# Patient Record
Sex: Female | Born: 1950 | Race: Black or African American | Hispanic: No | Marital: Married | Smoking: Never smoker
Health system: Southern US, Community
[De-identification: ages and names within clinical notes are randomized; demographics above are authoritative.]

## PROBLEM LIST (undated history)

## (undated) DIAGNOSIS — R202 Paresthesia of skin: Secondary | ICD-10-CM

## (undated) DIAGNOSIS — I10 Essential (primary) hypertension: Secondary | ICD-10-CM

## (undated) HISTORY — PX: APPENDECTOMY: SHX54

## (undated) HISTORY — PX: COLONOSCOPY: SHX174

---

## 2015-12-16 ENCOUNTER — Other Ambulatory Visit: Payer: Self-pay | Admitting: Neurosurgery

## 2015-12-27 ENCOUNTER — Encounter (HOSPITAL_COMMUNITY)
Admission: RE | Admit: 2015-12-27 | Discharge: 2015-12-27 | Disposition: A | Discharge status: Home / Self Care | Payer: Medicare Other | Source: Ambulatory Visit | Attending: Neurosurgery | Admitting: Neurosurgery

## 2015-12-27 ENCOUNTER — Encounter (HOSPITAL_COMMUNITY): Payer: Self-pay | Admitting: Urology

## 2015-12-27 HISTORY — DX: Paresthesia of skin: R20.2

## 2015-12-27 HISTORY — DX: Essential (primary) hypertension: I10

## 2015-12-27 LAB — CBC
HEMATOCRIT: 39.5 % (ref 36.0–46.0)
HEMOGLOBIN: 13.3 g/dL (ref 12.0–15.0)
MCH: 30 pg (ref 26.0–34.0)
MCHC: 33.7 g/dL (ref 30.0–36.0)
MCV: 89.2 fL (ref 78.0–100.0)
Platelets: 255 10*3/uL (ref 150–400)
RBC: 4.43 MIL/uL (ref 3.87–5.11)
RDW: 13.8 % (ref 11.5–15.5)
WBC: 5.9 10*3/uL (ref 4.0–10.5)

## 2015-12-27 LAB — BASIC METABOLIC PANEL
ANION GAP: 10 (ref 5–15)
BUN: 9 mg/dL (ref 6–20)
CHLORIDE: 101 mmol/L (ref 101–111)
CO2: 26 mmol/L (ref 22–32)
Calcium: 9.6 mg/dL (ref 8.9–10.3)
Creatinine, Ser: 0.81 mg/dL (ref 0.44–1.00)
Glucose, Bld: 116 mg/dL — ABNORMAL HIGH (ref 65–99)
POTASSIUM: 3.7 mmol/L (ref 3.5–5.1)
SODIUM: 137 mmol/L (ref 135–145)

## 2015-12-27 LAB — TYPE AND SCREEN
ABO/RH(D): O POS
ANTIBODY SCREEN: NEGATIVE

## 2015-12-27 LAB — SURGICAL PCR SCREEN
MRSA, PCR: NEGATIVE
STAPHYLOCOCCUS AUREUS: NEGATIVE

## 2015-12-27 LAB — ABO/RH: ABO/RH(D): O POS

## 2015-12-27 NOTE — Progress Notes (Signed)
Anesthesia chart review: Patient is a 65 year old female scheduled for L4-5 PLIF on 12/28/15 by Dr. Franky Machoabbell. PAT was this morning.  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  PCP is Donnita FallsWendy Rodriguez, NP at [facility] in [place], [place].  Meds include Zovirax, Activella, tramadol, Diovan-HCT, vitamin D, OTC supplement for hot flashes.  BP (!) 157/74   Pulse 79   Temp 36.9 C (Oral)   Resp 18   Ht 5\' 7"  (1.702 m)   Wt 174 lb 5 oz (79.1 kg)   LMP  (Approximate) Comment: LMP 4 years ago  SpO2 100%   BMI 27.30 kg/m   12/27/15 EKG: NSR, first degree AV block. Patient denied any recent EKG or cardiac studies (reported normal stress and echo 20 years ago). She denied CP and SOB at PAT.  Preoperative labs noted.   If no acute changes then I anticipate that she can proceed as planned.  Velna Ochsllison Keywon Mestre, PA-C [facility]Anesthesiology Phone [phone] 12/27/2015 11:19 AM

## 2015-12-27 NOTE — Progress Notes (Addendum)
PCP: [name], [name] in [place] No cardiologist.  Echo and stress test, EKG done 20 yr ago per pt, pt reports no cardiac issues. Pt does not remember which doctor did these tests  EKG: 12/27/15, 1st degree AV block, EKG forwarded to Edmonia CaprioAllison Z with anesthesia  No complaints of chest pain, SOB or signs of infection per pt at PAT appointment.

## 2015-12-27 NOTE — Pre-Procedure Instructions (Signed)
Nancy ReeBrenda Pratt  12/27/2015      [facility] [phone] - [place] - [address] AT [facility] [address] [place] [id number] Phone: [phone] Fax: [phone]    Your procedure is scheduled on Wednesday October 25.  [facility] Admitting at 9:00 A.M.  Call this number if you have problems the morning of surgery:  [phone]   Remember:  Do not eat food or drink liquids after midnight.  Take these medicines the morning of surgery with A SIP OF WATER: acyclovir (Zovirax), Activella, tramadol (Ultram)  STOP taking any Aspirin, Aleve, Naproxen, Ibuprofen, Motrin, Advil, Goody's, BC's, all herbal medications, fish oil, and all vitamins    Do not wear jewelry, make-up or nail polish.  Do not wear lotions, powders, or perfumes, or deoderant.  Do not shave 48 hours prior to surgery.  Men may shave face and neck.  Do not bring valuables to the hospital.  [facility] is not responsible for any belongings or valuables.  Contacts, dentures or bridgework may not be worn into surgery.  Leave your suitcase in the car.  After surgery it may be brought to your room.  For patients admitted to the hospital, discharge time will be determined by your treatment team.  Patients discharged the day of surgery will not be allowed to drive home.   Special instructions:     [place]- Preparing For Surgery  Before surgery, you can play an important role. Because skin is not sterile, your skin needs to be as free of germs as possible. You can reduce the number of germs on your skin by washing with CHG (chlorahexidine gluconate) Soap before surgery.  CHG is an antiseptic cleaner which kills germs and bonds with the skin to continue killing germs even after washing.  Please do not use if you have an allergy to CHG or antibacterial soaps. If your skin becomes reddened/irritated stop using the CHG.  Do not shave (including legs and underarms)  for at least 48 hours prior to first CHG shower. It is OK to shave your face.  Please follow these instructions carefully.   1. Shower the NIGHT BEFORE SURGERY and the MORNING OF SURGERY with CHG.   2. If you chose to wash your hair, wash your hair first as usual with your normal shampoo.  3. After you shampoo, rinse your hair and body thoroughly to remove the shampoo.  4. Use CHG as you would any other liquid soap. You can apply CHG directly to the skin and wash gently with a scrungie or a clean washcloth.   5. Apply the CHG Soap to your body ONLY FROM THE NECK DOWN.  Do not use on open wounds or open sores. Avoid contact with your eyes, ears, mouth and genitals (private parts). Wash genitals (private parts) with your normal soap.  6. Wash thoroughly, paying special attention to the area where your surgery will be performed.  7. Thoroughly rinse your body with warm water from the neck down.  8. DO NOT shower/wash with your normal soap after using and rinsing off the CHG Soap.  9. Pat yourself dry with a CLEAN TOWEL.   10. Wear CLEAN PAJAMAS   11. Place CLEAN SHEETS on your bed the night of your first shower and DO NOT SLEEP WITH PETS.    Day of Surgery: Do not apply any deodorants/lotions. Please wear clean clothes to the hospital/surgery center.  Please read over the following fact sheets that you were given. MRSA Information

## 2015-12-28 ENCOUNTER — Encounter (HOSPITAL_COMMUNITY): Payer: Self-pay | Admitting: *Deleted

## 2015-12-28 ENCOUNTER — Inpatient Hospital Stay (HOSPITAL_COMMUNITY): Payer: Medicare Other

## 2015-12-28 ENCOUNTER — Inpatient Hospital Stay (HOSPITAL_COMMUNITY): Payer: Medicare Other | Admitting: Certified Registered Nurse Anesthetist

## 2015-12-28 ENCOUNTER — Inpatient Hospital Stay (HOSPITAL_COMMUNITY)
Admission: RE | Admit: 2015-12-28 | Discharge: 2015-12-31 | DRG: 460 | Disposition: A | Discharge status: Home / Self Care | Payer: Medicare Other | Source: Ambulatory Visit | Attending: Neurosurgery | Admitting: Neurosurgery

## 2015-12-28 ENCOUNTER — Encounter (HOSPITAL_COMMUNITY)
Admission: RE | Disposition: A | Discharge status: Home / Self Care | Payer: Self-pay | Source: Ambulatory Visit | Attending: Neurosurgery

## 2015-12-28 DIAGNOSIS — Z419 Encounter for procedure for purposes other than remedying health state, unspecified: Secondary | ICD-10-CM

## 2015-12-28 DIAGNOSIS — M4316 Spondylolisthesis, lumbar region: Principal | ICD-10-CM | POA: Diagnosis present

## 2015-12-28 DIAGNOSIS — M5416 Radiculopathy, lumbar region: Secondary | ICD-10-CM | POA: Diagnosis present

## 2015-12-28 DIAGNOSIS — Z79899 Other long term (current) drug therapy: Secondary | ICD-10-CM

## 2015-12-28 DIAGNOSIS — I10 Essential (primary) hypertension: Secondary | ICD-10-CM | POA: Diagnosis present

## 2015-12-28 LAB — CBC
HCT: 35 % — ABNORMAL LOW (ref 36.0–46.0)
Hemoglobin: 11.8 g/dL — ABNORMAL LOW (ref 12.0–15.0)
MCH: 29.9 pg (ref 26.0–34.0)
MCHC: 33.7 g/dL (ref 30.0–36.0)
MCV: 88.8 fL (ref 78.0–100.0)
Platelets: 211 10*3/uL (ref 150–400)
RBC: 3.94 MIL/uL (ref 3.87–5.11)
RDW: 13.7 % (ref 11.5–15.5)
WBC: 9.9 10*3/uL (ref 4.0–10.5)

## 2015-12-28 LAB — GLUCOSE, CAPILLARY: GLUCOSE-CAPILLARY: 160 mg/dL — AB (ref 65–99)

## 2015-12-28 LAB — CREATININE, SERUM
Creatinine, Ser: 0.83 mg/dL (ref 0.44–1.00)
GFR calc Af Amer: >60 mL/min (ref >60)
GFR calc non Af Amer: >60 mL/min (ref >60)

## 2015-12-28 SURGERY — POSTERIOR LUMBAR FUSION 1 LEVEL
Anesthesia: General | Site: Spine Lumbar

## 2015-12-28 MED ORDER — ACETAMINOPHEN 325 MG PO TABS: 650.0000 mg | ORAL_TABLET | ORAL | Status: DC | PRN

## 2015-12-28 MED ORDER — LIDOCAINE-EPINEPHRINE 1 %-1:100000 IJ SOLN
INTRAMUSCULAR | Status: DC | PRN
  Administered 2015-12-28: 10 mL

## 2015-12-28 MED ORDER — THROMBIN 20000 UNITS EX SOLR
CUTANEOUS | Status: AC
  Filled 2015-12-28: qty 20000

## 2015-12-28 MED ORDER — ONDANSETRON HCL 4 MG/2ML IJ SOLN
INTRAMUSCULAR | Status: DC | PRN
  Administered 2015-12-28 (×2): 4 mg via INTRAVENOUS

## 2015-12-28 MED ORDER — ACYCLOVIR 800 MG PO TABS
800.0000 mg | ORAL_TABLET | Freq: Every day | ORAL | Status: DC
  Administered 2015-12-29 – 2015-12-31 (×3): 800 mg via ORAL
  Filled 2015-12-28 (×3): qty 1

## 2015-12-28 MED ORDER — VALSARTAN-HYDROCHLOROTHIAZIDE 80-12.5 MG PO TABS: 1.0000 | ORAL_TABLET | Freq: Every day | ORAL | Status: DC

## 2015-12-28 MED ORDER — MENTHOL 3 MG MT LOZG: 1.0000 | LOZENGE | OROMUCOSAL | Status: DC | PRN

## 2015-12-28 MED ORDER — DIAZEPAM 5 MG PO TABS
5.0000 mg | ORAL_TABLET | Freq: Four times a day (QID) | ORAL | Status: DC | PRN
  Administered 2015-12-31: 5 mg via ORAL
  Filled 2015-12-28: qty 1

## 2015-12-28 MED ORDER — HYDROMORPHONE HCL 2 MG/ML IJ SOLN
INTRAMUSCULAR | Status: AC
  Administered 2015-12-28 (×2): 0.5 mg
  Filled 2015-12-28: qty 1

## 2015-12-28 MED ORDER — DEXAMETHASONE SODIUM PHOSPHATE 10 MG/ML IJ SOLN
INTRAMUSCULAR | Status: DC | PRN
  Administered 2015-12-28: 10 mg via INTRAVENOUS

## 2015-12-28 MED ORDER — HYDROMORPHONE HCL 1 MG/ML IJ SOLN
0.2500 mg | INTRAMUSCULAR | Status: DC | PRN
  Administered 2015-12-28 (×2): 0.5 mg via INTRAVENOUS

## 2015-12-28 MED ORDER — HYDROMORPHONE HCL 2 MG/ML IJ SOLN
INTRAMUSCULAR | Status: AC
  Filled 2015-12-28: qty 1

## 2015-12-28 MED ORDER — PROMETHAZINE HCL 25 MG/ML IJ SOLN: 6.2500 mg | INTRAMUSCULAR | Status: DC | PRN

## 2015-12-28 MED ORDER — HEPARIN SODIUM (PORCINE) 5000 UNIT/ML IJ SOLN
5000.0000 [IU] | Freq: Three times a day (TID) | INTRAMUSCULAR | Status: DC
  Administered 2015-12-29 – 2015-12-31 (×5): 5000 [IU] via SUBCUTANEOUS
  Filled 2015-12-28 (×5): qty 1

## 2015-12-28 MED ORDER — SENNA 8.6 MG PO TABS
1.0000 | ORAL_TABLET | Freq: Two times a day (BID) | ORAL | Status: DC
  Administered 2015-12-29 – 2015-12-31 (×5): 8.6 mg via ORAL
  Filled 2015-12-28 (×5): qty 1

## 2015-12-28 MED ORDER — POTASSIUM CHLORIDE IN NACL 20-0.9 MEQ/L-% IV SOLN
INTRAVENOUS | Status: DC
  Administered 2015-12-29: 04:00:00 via INTRAVENOUS
  Filled 2015-12-28 (×2): qty 1000

## 2015-12-28 MED ORDER — CEFAZOLIN SODIUM-DEXTROSE 2-4 GM/100ML-% IV SOLN
INTRAVENOUS | Status: AC
  Filled 2015-12-28: qty 100

## 2015-12-28 MED ORDER — MORPHINE SULFATE (PF) 2 MG/ML IV SOLN
1.0000 mg | INTRAVENOUS | Status: DC | PRN
  Administered 2015-12-30: 2 mg via INTRAVENOUS
  Filled 2015-12-28: qty 1

## 2015-12-28 MED ORDER — LACTATED RINGERS IV SOLN
INTRAVENOUS | Status: DC | PRN
  Administered 2015-12-28 (×2): via INTRAVENOUS

## 2015-12-28 MED ORDER — GLYCOPYRROLATE 0.2 MG/ML IJ SOLN
INTRAMUSCULAR | Status: DC | PRN
  Administered 2015-12-28: 0.4 mg via INTRAVENOUS
  Administered 2015-12-28: 0.2 mg via INTRAVENOUS

## 2015-12-28 MED ORDER — NEOSTIGMINE METHYLSULFATE 10 MG/10ML IV SOLN
INTRAVENOUS | Status: DC | PRN
  Administered 2015-12-28: 3 mg via INTRAVENOUS

## 2015-12-28 MED ORDER — LACTATED RINGERS IV SOLN
Freq: Once | INTRAVENOUS | Status: AC
  Administered 2015-12-28: 09:00:00 via INTRAVENOUS

## 2015-12-28 MED ORDER — FENTANYL CITRATE (PF) 100 MCG/2ML IJ SOLN
INTRAMUSCULAR | Status: DC | PRN
  Administered 2015-12-28 (×3): 50 ug via INTRAVENOUS

## 2015-12-28 MED ORDER — 0.9 % SODIUM CHLORIDE (POUR BTL) OPTIME
TOPICAL | Status: DC | PRN
  Administered 2015-12-28: 1000 mL

## 2015-12-28 MED ORDER — FENTANYL CITRATE (PF) 100 MCG/2ML IJ SOLN
INTRAMUSCULAR | Status: AC
  Filled 2015-12-28: qty 4

## 2015-12-28 MED ORDER — ESTRADIOL-NORETHINDRONE ACET 1-0.5 MG PO TABS: 1.0000 | ORAL_TABLET | Freq: Every day | ORAL | Status: DC

## 2015-12-28 MED ORDER — EPHEDRINE SULFATE 50 MG/ML IJ SOLN
INTRAMUSCULAR | Status: DC | PRN
  Administered 2015-12-28: 5 mg via INTRAVENOUS

## 2015-12-28 MED ORDER — BUPIVACAINE HCL (PF) 0.5 % IJ SOLN
INTRAMUSCULAR | Status: AC
  Filled 2015-12-28: qty 30

## 2015-12-28 MED ORDER — LIDOCAINE HCL (CARDIAC) 20 MG/ML IV SOLN
INTRAVENOUS | Status: DC | PRN
  Administered 2015-12-28: 60 mg via INTRAVENOUS

## 2015-12-28 MED ORDER — SODIUM CHLORIDE 0.9 % IV SOLN
250.0000 mL | INTRAVENOUS | Status: DC
Start: 2015-12-28 — End: 2015-12-31

## 2015-12-28 MED ORDER — HYDROCODONE-ACETAMINOPHEN 5-325 MG PO TABS
1.0000 | ORAL_TABLET | ORAL | Status: DC | PRN
  Administered 2015-12-29 – 2015-12-31 (×3): 2 via ORAL
  Filled 2015-12-28 (×4): qty 2

## 2015-12-28 MED ORDER — ONDANSETRON HCL 4 MG/2ML IJ SOLN: 4.0000 mg | INTRAMUSCULAR | Status: DC | PRN

## 2015-12-28 MED ORDER — PHENOL 1.4 % MT LIQD: 1.0000 | OROMUCOSAL | Status: DC | PRN

## 2015-12-28 MED ORDER — SODIUM CHLORIDE 0.9% FLUSH
3.0000 mL | Freq: Two times a day (BID) | INTRAVENOUS | Status: DC
  Administered 2015-12-29 – 2015-12-30 (×2): 3 mL via INTRAVENOUS

## 2015-12-28 MED ORDER — OXYCODONE-ACETAMINOPHEN 5-325 MG PO TABS
1.0000 | ORAL_TABLET | ORAL | Status: DC | PRN
  Administered 2015-12-28 – 2015-12-31 (×11): 2 via ORAL
  Filled 2015-12-28 (×11): qty 2

## 2015-12-28 MED ORDER — ARTIFICIAL TEARS OP OINT
TOPICAL_OINTMENT | OPHTHALMIC | Status: DC | PRN
  Administered 2015-12-28: 1 via OPHTHALMIC

## 2015-12-28 MED ORDER — THROMBIN 20000 UNITS EX SOLR
CUTANEOUS | Status: DC | PRN
  Administered 2015-12-28: 15:00:00 via TOPICAL

## 2015-12-28 MED ORDER — SODIUM CHLORIDE 0.9% FLUSH: 3.0000 mL | INTRAVENOUS | Status: DC | PRN

## 2015-12-28 MED ORDER — ZOLPIDEM TARTRATE 5 MG PO TABS: 5.0000 mg | ORAL_TABLET | Freq: Every evening | ORAL | Status: DC | PRN

## 2015-12-28 MED ORDER — VITAMIN D (ERGOCALCIFEROL) 1.25 MG (50000 UNIT) PO CAPS: 50000.0000 [IU] | ORAL_CAPSULE | ORAL | Status: DC

## 2015-12-28 MED ORDER — ROCURONIUM BROMIDE 100 MG/10ML IV SOLN
INTRAVENOUS | Status: DC | PRN
  Administered 2015-12-28: 50 mg via INTRAVENOUS

## 2015-12-28 MED ORDER — MIDAZOLAM HCL 5 MG/5ML IJ SOLN
INTRAMUSCULAR | Status: DC | PRN
  Administered 2015-12-28: 2 mg via INTRAVENOUS

## 2015-12-28 MED ORDER — BUPIVACAINE HCL (PF) 0.5 % IJ SOLN
INTRAMUSCULAR | Status: DC | PRN
  Administered 2015-12-28: 30 mL

## 2015-12-28 MED ORDER — LIDOCAINE-EPINEPHRINE 1 %-1:100000 IJ SOLN
INTRAMUSCULAR | Status: AC
  Filled 2015-12-28: qty 1

## 2015-12-28 MED ORDER — BISACODYL 5 MG PO TBEC: 5.0000 mg | DELAYED_RELEASE_TABLET | Freq: Every day | ORAL | Status: DC | PRN

## 2015-12-28 MED ORDER — CEFAZOLIN SODIUM-DEXTROSE 2-3 GM-% IV SOLR
INTRAVENOUS | Status: DC | PRN
  Administered 2015-12-28: 2 g via INTRAVENOUS

## 2015-12-28 MED ORDER — ACETAMINOPHEN 650 MG RE SUPP
650.0000 mg | RECTAL | Status: DC | PRN
Start: 2015-12-28 — End: 2015-12-31

## 2015-12-28 MED ORDER — IRBESARTAN 150 MG PO TABS
75.0000 mg | ORAL_TABLET | Freq: Every day | ORAL | Status: DC
  Administered 2015-12-31: 75 mg via ORAL
  Filled 2015-12-28: qty 1

## 2015-12-28 MED ORDER — HYDROCHLOROTHIAZIDE 12.5 MG PO CAPS
12.5000 mg | ORAL_CAPSULE | Freq: Every day | ORAL | Status: DC
  Administered 2015-12-31: 12.5 mg via ORAL
  Filled 2015-12-28: qty 1

## 2015-12-28 MED ORDER — SENNOSIDES-DOCUSATE SODIUM 8.6-50 MG PO TABS: 1.0000 | ORAL_TABLET | Freq: Every evening | ORAL | Status: DC | PRN

## 2015-12-28 MED ORDER — PROPOFOL 10 MG/ML IV BOLUS
INTRAVENOUS | Status: DC | PRN
  Administered 2015-12-28: 150 mg via INTRAVENOUS

## 2015-12-28 MED ORDER — MAGNESIUM CITRATE PO SOLN: 1.0000 | Freq: Once | ORAL | Status: DC | PRN

## 2015-12-28 MED ORDER — MIDAZOLAM HCL 2 MG/2ML IJ SOLN
INTRAMUSCULAR | Status: AC
  Filled 2015-12-28: qty 2

## 2015-12-28 SURGICAL SUPPLY — 54 items
BUR MATCHSTICK NEURO 3.0 LAGG (BURR) ×2 IMPLANT
BUR PRECISION FLUTE 5.0 (BURR) ×2 IMPLANT
CANISTER SUCT 3000ML PPV (MISCELLANEOUS) ×2 IMPLANT
CAP RELINE MOD TULIP RMM (Cap) ×8 IMPLANT
CONT SPEC 4OZ CLIKSEAL STRL BL (MISCELLANEOUS) ×2 IMPLANT
COVER BACK TABLE 60X90IN (DRAPES) ×2 IMPLANT
DERMABOND ADVANCED (GAUZE/BANDAGES/DRESSINGS) ×1
DERMABOND ADVANCED .7 DNX12 (GAUZE/BANDAGES/DRESSINGS) ×1 IMPLANT
DRAPE C-ARM 42X72 X-RAY (DRAPES) ×4 IMPLANT
DRAPE C-ARMOR (DRAPES) ×2 IMPLANT
DRAPE LAPAROTOMY 100X72X124 (DRAPES) ×2 IMPLANT
DRAPE POUCH INSTRU U-SHP 10X18 (DRAPES) ×2 IMPLANT
DRAPE SURG 17X23 STRL (DRAPES) ×2 IMPLANT
DRSG OPSITE POSTOP 4X6 (GAUZE/BANDAGES/DRESSINGS) ×2 IMPLANT
DURAPREP 26ML APPLICATOR (WOUND CARE) ×2 IMPLANT
ELECT REM PT RETURN 9FT ADLT (ELECTROSURGICAL) ×2
ELECTRODE REM PT RTRN 9FT ADLT (ELECTROSURGICAL) ×1 IMPLANT
GAUZE SPONGE 4X4 12PLY STRL (GAUZE/BANDAGES/DRESSINGS) IMPLANT
GAUZE SPONGE 4X4 16PLY XRAY LF (GAUZE/BANDAGES/DRESSINGS) IMPLANT
GLOVE BIO SURGEON STRL SZ8 (GLOVE) ×2 IMPLANT
GLOVE BIO SURGEON STRL SZ8.5 (GLOVE) ×2 IMPLANT
GLOVE ECLIPSE 6.5 STRL STRAW (GLOVE) ×4 IMPLANT
GOWN STRL REUS W/ TWL LRG LVL3 (GOWN DISPOSABLE) ×2 IMPLANT
GOWN STRL REUS W/ TWL XL LVL3 (GOWN DISPOSABLE) IMPLANT
GOWN STRL REUS W/TWL 2XL LVL3 (GOWN DISPOSABLE) IMPLANT
GOWN STRL REUS W/TWL LRG LVL3 (GOWN DISPOSABLE) ×2
GOWN STRL REUS W/TWL XL LVL3 (GOWN DISPOSABLE)
KIT BASIN OR (CUSTOM PROCEDURE TRAY) ×2 IMPLANT
KIT POSITION SURG JACKSON T1 (MISCELLANEOUS) ×2 IMPLANT
KIT ROOM TURNOVER OR (KITS) ×2 IMPLANT
NEEDLE HYPO 25X1 1.5 SAFETY (NEEDLE) ×2 IMPLANT
NEEDLE SPNL 18GX3.5 QUINCKE PK (NEEDLE) IMPLANT
NS IRRIG 1000ML POUR BTL (IV SOLUTION) ×2 IMPLANT
PACK LAMINECTOMY NEURO (CUSTOM PROCEDURE TRAY) ×2 IMPLANT
PAD ARMBOARD 7.5X6 YLW CONV (MISCELLANEOUS) ×4 IMPLANT
PUTTY DBX 1CC (Putty) ×2 IMPLANT
PUTTY DBX 1CC DEPUY (Putty) ×1 IMPLANT
ROD RELINE COCR LORD 5X30 (Rod) ×4 IMPLANT
SCREW LOCK RSS 4.5/5.0MM (Screw) ×8 IMPLANT
SCREW SHANK RELINE MOD 5.5X35 (Screw) ×8 IMPLANT
SPACER OPAL 10X24 (Orthopedic Implant) ×2 IMPLANT
SPACER OPAL 10X24 12MM (Spacer) ×2 IMPLANT
SPONGE LAP 4X18 X RAY DECT (DISPOSABLE) IMPLANT
SPONGE SURGIFOAM ABS GEL 100 (HEMOSTASIS) ×2 IMPLANT
STRIP CLOSURE SKIN 1/2X4 (GAUZE/BANDAGES/DRESSINGS) IMPLANT
SUT PROLENE 6 0 BV (SUTURE) IMPLANT
SUT VIC AB 0 CT1 18XCR BRD8 (SUTURE) ×1 IMPLANT
SUT VIC AB 0 CT1 8-18 (SUTURE) ×1
SUT VIC AB 2-0 CT1 18 (SUTURE) ×2 IMPLANT
SUT VIC AB 3-0 SH 8-18 (SUTURE) ×2 IMPLANT
TOWEL OR 17X24 6PK STRL BLUE (TOWEL DISPOSABLE) ×2 IMPLANT
TOWEL OR 17X26 10 PK STRL BLUE (TOWEL DISPOSABLE) ×2 IMPLANT
TRAY FOLEY W/METER SILVER 16FR (SET/KITS/TRAYS/PACK) ×2 IMPLANT
WATER STERILE IRR 1000ML POUR (IV SOLUTION) ×2 IMPLANT

## 2015-12-28 NOTE — Transfer of Care (Signed)
Immediate Anesthesia Transfer of Care Note  Patient: Nancy Pratt  Procedure(s) Performed: Procedure(s) with comments: LUMBAR FOUR-FIVE POSTERIOR LUMBAR INTERBODY FUSION (N/A) - POSTERIOR LUMBAR INTERBODY FUSION 1 L4-L5  Patient Location: PACU  Anesthesia Type:General  Level of Consciousness: awake  Airway & Oxygen Therapy: Patient Spontanous Breathing  Post-op Assessment: Report given to RN and Post -op Vital signs reviewed and stable  Post vital signs: Reviewed and stable  Last Vitals:  Vitals:   12/28/15 0908  BP: (!) 174/77  Pulse: 70  Resp: 18  Temp: 36.9 C    Last Pain:  Vitals:   12/28/15 0908  TempSrc: Oral         Complications: No apparent anesthesia complications

## 2015-12-28 NOTE — H&P (Signed)
BP (!) 174/77   Pulse 70   Temp 98.4 F (36.9 C) (Oral)   Resp 18   Ht 5\' 7"  (1.702 m)   Wt 79.1 kg (174 lb 5 oz)   LMP  (Approximate)   SpO2 99%   BMI 27.30 kg/m  Nancy Pratt is a 65 year old woman who presents today for evaluation of pain that she has in her back and lower extremities, right much worse than left. She has had this pain now for a number of years, and has been undergoing conservative treatment.  In her words, she has ongoing pain, swelling of the left lower extremity, tingling and neck popping with no pain. She says her problems started in 11/2013. She has no recollection of a specific etiology prior to the pain onset. She has pain in her legs, back, and buttocks, sometimes resting or sitting will help relieve the pain. She says the pain is 6/10. It has gotten worse over the last six months, and has recently been bad. She had to quit work in 05/2015 because she can no longer tolerate being on her feet. She does have numbness and tingling in her toes and feet. She has had recent weight gain because she is unable to exercise or walk on a track. She has had epidural injections, she has had dry needling, she has had physical therapy, she has been under the care of both a primary care physician and her other physician during this time which is over a year without there being any relief. She has also had chiropractic treatment massage therapy, ice, and she has used a TENS unit. REVIEW OF SYSTEMS: Review of systems is positive for night sweats, balance problems, hypercholesterolemia, swelling in the hands and legs, leg pain with walking, leg weakness, leg pain, joint pain. She says mostly all the pain is in back, legs, joint swelling on the right side of the body, most leg pain and swelling in intact. Her tingling in the toes is constant making it difficult to sleep.  PAST MEDICAL HISTORY: Past medical history is significant for hypertension. PAST SURGICAL HISTORY: She has had a cesarean  section. FAMILY HISTORY: Mother is deceased. Father is 5087 in fair health, has hypertension and thyroid disease. Hypertension, diabetes, visual problems, and multiple sclerosis present in the family history. SOCIAL HISTORY: She does not smoke. She does not drink alcohol. She has no history of substance abuse. She is currently retired.  CURRENT MEDICATIONS: Medications are Activella, Valsartan, acyclovir, Vitamin D, and over-the-counter Turmeric.  PHYSICAL EXAMINATION: On examination she is alert, oriented x4, answering all questions appropriately. 5/5 strength in the upper and lower extremities. Normal muscle tone, bulk, coordination. Pupils are equal, round, and reactive to light. Full extraocular movements. Full visual fields. Hearing is intact to voice bilaterally. Uvula elevates in the midline. Shoulder shrug is normal. Tongue protrudes in the midline. She has an antalgic gait. Negative Romberg. She can stand on her toes and heels. Proprioception is intact in the lower extremities.  IMAGING STUDIES: MRI from 01/2015 and 04/2014 were reviewed. They showed significant facet arthropathy and foraminal compromise on the right side at L4-5 with a grade 1 spondylolisthesis uncovering of the disc at that level, spinal stenosis at that level, severe ligamentous hypertrophy at the L4-5 level. The saggital suggest a compromise nerve root on the right side at L5-S1, but when you look at the axials, you just do not get the same feeling the by means it is normal.  IMPRESSION/PLAN: Diagnosis is spondylolisthesis  lumbar spine, and lumbar radiculopathy. I gave her a detailed instruction sheet with regards to lumbar fusion and decompression, which would involve the use of pedicle screws and rods, and interbody cages.  She, at this point, says that she just has not been able to get better. There has been no improvement and at this point she would like to proceed with decompression and arthrodesis. We have discussed at length  the procedure at her last visit. So, she has a good understanding of what will be happening. She also received a detailed instruction sheet. I went over the risks and benefits, including bleeding, infection, no relief, need for further surgery, fusion failure, hardware failure, damage to the nerves, bowel or bladder dysfunction, and weakness in one or both lower extremities. She understands and wishes to proceed. We will schedule her procedure for 12/21/2015.

## 2015-12-28 NOTE — Anesthesia Preprocedure Evaluation (Signed)
Anesthesia Evaluation  Patient identified by MRN, date of birth, ID band Patient awake    Reviewed: Allergy & Precautions, NPO status , Patient's Chart, lab work & pertinent test results  Airway Mallampati: II  TM Distance: >3 FB Neck ROM: Full    Dental no notable dental hx.    Pulmonary neg pulmonary ROS,    Pulmonary exam normal breath sounds clear to auscultation       Cardiovascular hypertension, Normal cardiovascular exam Rhythm:Regular Rate:Normal     Neuro/Psych negative neurological ROS  negative psych ROS   GI/Hepatic negative GI ROS, Neg liver ROS,   Endo/Other  negative endocrine ROS  Renal/GU negative Renal ROS  negative genitourinary   Musculoskeletal negative musculoskeletal ROS (+)   Abdominal   Peds negative pediatric ROS (+)  Hematology negative hematology ROS (+)   Anesthesia Other Findings   Reproductive/Obstetrics negative OB ROS                             Anesthesia Physical Anesthesia Plan  ASA: II  Anesthesia Plan: General   Post-op Pain Management:    Induction: Intravenous  Airway Management Planned: Oral ETT  Additional Equipment:   Intra-op Plan:   Post-operative Plan: Extubation in OR  Informed Consent: I have reviewed the patients History and Physical, chart, labs and discussed the procedure including the risks, benefits and alternatives for the proposed anesthesia with the patient or authorized representative who has indicated his/her understanding and acceptance.   Dental advisory given  Plan Discussed with: CRNA and Surgeon  Anesthesia Plan Comments:         Anesthesia Quick Evaluation  

## 2015-12-28 NOTE — Anesthesia Procedure Notes (Signed)
Procedure Name: Intubation Date/Time: 12/28/2015 1:28 PM Performed by: Gavin PoundLOWDER, Iman Orourke J Pre-anesthesia Checklist: Patient identified, Emergency Drugs available, Suction available, Patient being monitored and Timeout performed

## 2015-12-28 NOTE — Progress Notes (Signed)
Patient arrived to unit

## 2015-12-28 NOTE — Anesthesia Postprocedure Evaluation (Signed)
Anesthesia Post Note  Patient: Nancy Pratt  Procedure(s) Performed: Procedure(s) (LRB): LUMBAR FOUR-FIVE POSTERIOR LUMBAR INTERBODY FUSION (N/A)  Patient location during evaluation: PACU Anesthesia Type: General Level of consciousness: awake and alert Pain management: pain level controlled Vital Signs Assessment: post-procedure vital signs reviewed and stable Respiratory status: spontaneous breathing, nonlabored ventilation, respiratory function stable and patient connected to nasal cannula oxygen Cardiovascular status: blood pressure returned to baseline and stable Postop Assessment: no signs of nausea or vomiting Anesthetic complications: no    Last Vitals:  Vitals:   12/28/15 0908  BP: (!) 174/77  Pulse: 70  Resp: 18  Temp: 36.9 C    Last Pain:  Vitals:   12/28/15 0908  TempSrc: Oral                 Reino KentJudd, Guynell Kleiber J

## 2015-12-28 NOTE — Op Note (Signed)
12/28/2015  5:01 PM  PATIENT:  Nancy Pratt  65 y.o. female with severe stenosis due to spondylolisthesis at L4/5. She has agreed to lumbar decompression and arthrodesis.  PRE-OPERATIVE DIAGNOSIS:  SPONDYLOLISTHESIS L4/5, osteoarthritis with radiculopathy lumbar spine  POST-OPERATIVE DIAGNOSIS:  same  PROCEDURE:  Procedure(s): LUMBAR FOUR-FIVE POSTERIOR LUMBAR INTERBODY FUSION, 11mm cage on right, 12mm x 24mm on left. Autograft and allograft morsels in the cages(Synthes) Lumbar laminectomy for decompression L4, L5 nerve roots Non segmental pedicle screw fixation L4, L5  SURGEON:  Surgeon(s): Coletta MemosKyle Neema Fluegge, MD Tressie StalkerJeffrey Jenkins, MD  ASSISTANTS:Jenkins, Tinnie GensJeffrey  ANESTHESIA:   general  EBL:  Total I/O In: 1550 [I.V.:1550] Out: 195 [Urine:95; Blood:100]  BLOOD ADMINISTERED:none  CELL SAVER GIVEN:none  COUNT:per nursing  DRAINS: none   SPECIMEN:  No Specimen  DICTATION: Nancy ReeBrenda Harbor is a 65 y.o. female whom was taken to the operating room intubated, and placed under a general anesthetic without difficulty. A foley catheter was placed under sterile conditions. She was positioned prone on a Jackson stable with all pressure points properly padded.  Her lumbar region was prepped and draped in a sterile manner. I infiltrated 30cc's 1/2%lidocaine/1:2000,000 strength epinephrine into the planned incision. I opened the skin with a 10 blade and took the incision down to the thoracolumbar fascia. I exposed the lamina of L4, and L5 in a subperiosteal fashion bilaterally. I confirmed my location with an intraoperative xray.  I placed self retaining retractors and started the decompression.  I decompressed the spinal canal via complete inferior facetectomies of L4 and L4 bilateral hemilaminectomes sparing the spinous processes. I used the drill and Kerrison punches to remove the ligamentum flavum, and to create a path from the canal to the extra foraminal space without compression. I exposed  far more than what was necessary to perform the PLIF. I did expose the disc spaces bilaterally. PLIF's were performed at L4/5 in the same fashion. I opened the disc space with a 15 blade then used a variety of instruments to remove the disc and prepare the space for the arthrodesis. I used curettes, rongeurs, punches, shavers for the disc space, and rasps in the discetomy. I measured the disc space and placed 2  Peek cages, 12mm x4224mm on the left, and 11mm x3524mm on the right(Synthes) into the disc space(s).   We placed pedicle screws at L4 and L5, using fluoroscopic guidance. I drilled a pilot hole, then cannulated the pedicle with a drill at each site. I then tapped each pedicle, assessing each site for pedicle violations. No cutouts were appreciated. Screws (nuvasive mas plif new) were then placed at each site without difficulty. We attached rods and locking caps with the appropriate tools. The locking caps were secured with torque limited screwdrivers. Final films were performed and the final construct appeared to be in good position.  I closed the wound in a layered fashion. I approximated the thoracolumbar fascia, subcutaneous, and subcuticular planes with vicryl sutures. I used dermabond and an occlusive bandage for a sterile dressing.     PLAN OF CARE: Admit to inpatient   PATIENT DISPOSITION:  PACU - hemodynamically stable.   Delay start of Pharmacological VTE agent (>24hrs) due to surgical blood loss or risk of bleeding:  yes

## 2015-12-29 LAB — GLUCOSE, CAPILLARY
GLUCOSE-CAPILLARY: 128 mg/dL — AB (ref 65–99)
GLUCOSE-CAPILLARY: 126 mg/dL — AB (ref 65–99)
Glucose-Capillary: 127 mg/dL — ABNORMAL HIGH (ref 65–99)

## 2015-12-29 NOTE — Evaluation (Signed)
Occupational Therapy Evaluation Patient Details Name: Nancy Pratt MRN: [id number] DOB: 09-Apr-1950 Today's Date: 12/29/2015    History of Present Illness Pt is a 65 y.o. female s/p lumbar 4-5 posterior lumbar interbody fusion, lumbar laminectomy for decompression L4,L5 nerve roots, and non segmental pedicle screw fixation L4, L5. Pt has a past medical history significant for but not limited to hypertension.   Clinical Impression   PTA, pt was independent with basic ADL and IADL. Pt limited in activity this session as brace has not been delivered at this time. Discussed with RN who is obtaining for pt. Pt requires supervision currently for seated ADL to maintain back precautions. Educated pt on back precautions during ADL and bed mobility this date as well as use of ice for pain management. Recommend D/C home with no OT follow-up and 24 hour supervision/assistance. Pt would benefit from continued OT services to improve independence with ADL and functional mobility. OT will continue to follow acutely to address donning/doffing brace, energy conservation, tub transfers, and functional mobility.    Follow Up Recommendations  No OT follow up;Supervision/Assistance - 24 hour    Equipment Recommendations  None recommended by OT       Precautions / Restrictions Precautions Precautions: Back Precaution Booklet Issued: Yes (comment) Precaution Comments: Reviewed back precautions and handout. Required Braces or Orthoses: Spinal Brace (Aspen Lumnar brace) Spinal Brace: Lumbar corset;Applied in sitting position Restrictions Weight Bearing Restrictions: No      Mobility Bed Mobility Overal bed mobility: Needs Assistance Bed Mobility: Rolling;Sidelying to Sit;Supine to Sit Rolling: Supervision Sidelying to sit: Supervision Supine to sit: Supervision     General bed mobility comments: Cueing for log roll technique to protect back.  Transfers Overall transfer level: Needs assistance    Transfers: Sit to/from Stand Sit to Stand: Supervision              Balance Overall balance assessment: Needs assistance Sitting-balance support: No upper extremity supported;Feet supported Sitting balance-Leahy Scale: Fair     Standing balance support: No upper extremity supported;During functional activity Standing balance-Leahy Scale: Fair                              ADL Overall ADL's : Needs assistance/impaired Eating/Feeding: Supervision/ safety;Set up;Sitting   Grooming: Supervision/safety;Set up;Oral care;Standing   Upper Body Bathing: Set up;Supervision/ safety;Sitting   Lower Body Bathing: Supervison/ safety;Set up;Sit to/from stand   Upper Body Dressing : Set up;Supervision/safety;Sitting (Cueing for back precautions.)   Lower Body Dressing: Supervision/safety;Set up;Sit to/from stand   Toilet Transfer: Supervision/safety;Ambulation (Close supervision)             General ADL Comments: Pt educated on back precautions during ADL, bed mobility, and dressing techniques.     Vision Vision Assessment?: No apparent visual deficits          Pertinent Vitals/Pain Pain Assessment: 0-10 Pain Score: 5  Pain Location: Low back (lumbar spine) Pain Descriptors / Indicators: Aching;Operative site guarding Pain Intervention(s): Limited activity within patient's tolerance;Monitored during session;Repositioned;Ice applied     Hand Dominance Right   Extremity/Trunk Assessment Upper Extremity Assessment Upper Extremity Assessment: Overall WFL for tasks assessed   Lower Extremity Assessment Lower Extremity Assessment: Overall WFL for tasks assessed   Cervical / Trunk Assessment Cervical / Trunk Assessment: Other exceptions Cervical / Trunk Exceptions: S/p spine surgery. Use of Aspen lulmbar brace.   Communication Communication Communication: No difficulties   Cognition Arousal/Alertness: Awake/alert Behavior During Therapy:  WFL for tasks  assessed/performed Overall Cognitive Status: Within Functional Limits for tasks assessed                                Home Living Family/patient expects to be discharged to:: Private residence Living Arrangements: Spouse/significant other Available Help at Discharge: Family;Available 24 hours/day Type of Home: House Home Access: Level entry     Home Layout: Two level;Able to live on main level with bedroom/bathroom     Bathroom Shower/Tub: Tub/shower unit;Curtain Shower/tub characteristics: Engineer, building services: Handicapped height     Home Equipment: Shower seat          Prior Functioning/Environment Level of Independence: Independent                 OT Problem List: Decreased strength;Decreased range of motion;Decreased activity tolerance;Impaired balance (sitting and/or standing);Decreased safety awareness;Decreased knowledge of use of DME or AE;Decreased knowledge of precautions;Pain   OT Treatment/Interventions: Self-care/ADL training;Therapeutic exercise;Therapeutic activities;Balance training;Patient/family education    OT Goals(Current goals can be found in the care plan section) Acute Rehab OT Goals Patient Stated Goal: to be independent OT Goal Formulation: With patient Time For Goal Achievement: 01/12/16 Potential to Achieve Goals: Good ADL Goals Pt Will Transfer to Toilet: with modified independence;ambulating (Handicapped height toilet) Pt Will Perform Toileting - Clothing Manipulation and hygiene: with supervision;sit to/from stand Pt Will Perform Tub/Shower Transfer: with supervision;shower seat;ambulating Additional ADL Goal #1: Pt will independently don and doff Aspen lumbar brace while adhering to back precautions.  OT Frequency: Min 2X/week    End of Session Equipment Utilized During Treatment: Gait belt Nurse Communication: Other (comment) (Needs brace. RN ordering.)  Activity Tolerance: Patient tolerated treatment well;Other  (comment) (Limited activity as brace unavailable as of yet.) Patient left: in bed;with call bell/phone within reach;with bed alarm set   Time: 1610-9604 OT Time Calculation (min): 24 min Charges:  OT General Charges $OT Visit: 1 Procedure OT Evaluation $OT Eval Moderate Complexity: 1 Procedure OT Treatments $Self Care/Home Management : 8-22 mins  Doristine Section, OTR/L [phone] 12/29/2015, 10:23 AM

## 2015-12-29 NOTE — Evaluation (Signed)
Physical Therapy Evaluation Patient Details Name: Nancy Pratt MRN: [id number] DOB: 1950-11-03 Today's Date: 12/29/2015   History of Present Illness  Pt is a 65 y.o. female s/p lumbar 4-5 posterior lumbar interbody fusion, lumbar laminectomy for decompression L4,L5 nerve roots, and non segmental pedicle screw fixation L4, L5. Pt has a past medical history significant for but not limited to hypertension.  Clinical Impression  Pt admitted with/for lumbar fusion surgery.  Pt currently limited functionally due to the problems listed below.  (see problems list.)  Pt will benefit from PT to maximize function and safety to be able to get home safely with available assist of family.     Follow Up Recommendations No PT follow up    Equipment Recommendations  None recommended by PT    Recommendations for Other Services       Precautions / Restrictions Precautions Precautions: Back Precaution Booklet Issued: Yes (comment) Precaution Comments: Reviewed back precautions and handout. Required Braces or Orthoses: Spinal Brace (Aspen Lumnar brace) Spinal Brace: Lumbar corset;Applied in sitting position Restrictions Weight Bearing Restrictions: No      Mobility  Bed Mobility Overal bed mobility: Needs Assistance Bed Mobility: Rolling;Sidelying to Sit;Supine to Sit Rolling: Supervision Sidelying to sit: Supervision Supine to sit: Supervision     General bed mobility comments: Cueing for log roll technique to protect back.  Transfers Overall transfer level: Needs assistance   Transfers: Sit to/from Stand Sit to Stand: Supervision            Ambulation/Gait Ambulation/Gait assistance: Supervision Ambulation Distance (Feet): 240 Feet Assistive device: None Gait Pattern/deviations: Step-through pattern   Gait velocity interpretation: at or above normal speed for age/gender General Gait Details: steady and able to change speed on cue  Stairs Stairs: Yes Stairs assistance:  Supervision Stair Management: One rail Right;Alternating pattern;Forwards Number of Stairs: 5 General stair comments: safe with rail  Wheelchair Mobility    Modified Rankin (Stroke Patients Only)       Balance Overall balance assessment: Needs assistance Sitting-balance support: No upper extremity supported;Feet supported Sitting balance-Leahy Scale: Good     Standing balance support: No upper extremity supported Standing balance-Leahy Scale: Fair                               Pertinent Vitals/Pain Pain Assessment: 0-10 Pain Score: 5  Pain Location: back Pain Descriptors / Indicators: Aching;Sore Pain Intervention(s): Monitored during session;Repositioned    Home Living Family/patient expects to be discharged to:: Private residence Living Arrangements: Spouse/significant other Available Help at Discharge: Family;Available 24 hours/day Type of Home: House Home Access: Level entry     Home Layout: Two level;Able to live on main level with bedroom/bathroom Home Equipment: Shower seat      Prior Function Level of Independence: Independent               Hand Dominance   Dominant Hand: Right    Extremity/Trunk Assessment   Upper Extremity Assessment: Overall WFL for tasks assessed           Lower Extremity Assessment: Overall WFL for tasks assessed      Cervical / Trunk Assessment: Other exceptions  Communication   Communication: No difficulties  Cognition Arousal/Alertness: Awake/alert Behavior During Therapy: WFL for tasks assessed/performed Overall Cognitive Status: Within Functional Limits for tasks assessed                      General Comments  General comments (skin integrity, edema, etc.): Reviewed all back care/precaution education, demonstrated and practiced bed mobility transitions    Exercises     Assessment/Plan    PT Assessment Patient needs continued PT services  PT Problem List Decreased  strength;Decreased activity tolerance;Decreased mobility;Decreased knowledge of precautions;Pain          PT Treatment Interventions Gait training;Functional mobility training;Therapeutic activities;Patient/family education    PT Goals (Current goals can be found in the Care Plan section)  Acute Rehab PT Goals Patient Stated Goal: to be independent PT Goal Formulation: With patient Time For Goal Achievement: 12/31/15 Potential to Achieve Goals: Good    Frequency Min 5X/week   Barriers to discharge        Co-evaluation               End of Session Equipment Utilized During Treatment: Back brace Activity Tolerance: Patient tolerated treatment well Patient left: in chair;with call bell/phone within reach Nurse Communication: Mobility status         Time: 1210-1232 PT Time Calculation (min) (ACUTE ONLY): 22 min   Charges:   PT Evaluation $PT Eval Low Complexity: 1 Procedure     PT G Codes:        Vernisha Bacote, Eliseo GumKenneth V 12/29/2015, 1:50 PM 12/29/2015  [place] BingKen Burnadette Baskett, PT [phone]  (pager)

## 2015-12-29 NOTE — Progress Notes (Signed)
[employer] paged regarding pt's back brace. Left message with Denny Peonvery to notify vendor. PT weight at 175 lbs, height 5'10"   Ami Thornsberry, RN

## 2015-12-29 NOTE — Progress Notes (Signed)
Orthopedic Tech Progress Note Patient Details:  Nancy Pratt 29-Jul-1950 [id number] [facility] Patient ID: Nancy Pratt, female   DOB: 29-Jul-1950, 65 y.o.   MRN: [id number]   [name] 12/29/2015, 12:34 PM

## 2015-12-29 NOTE — Progress Notes (Signed)
Foley removed per order at 0630. Mick SellShannon Keshan Reha RN

## 2015-12-29 NOTE — Progress Notes (Signed)
   12/29/15 0800  Mechanical VTE Prophylaxis (All Areas)  Mechanical VTE Prophylaxis Sequential compression devices, below knee  Mechanical VTE Prophylaxis Intervention SCD's on, removed for 30 minutes  Pressure Ulcer Prevention  Repositioned Sitting  Positioning Frequency Able to turn self  Mobility  Activity Ambulate in room;Chair  Level of Assistance Minimal assist, patient does 75% or more  Assistive Device Front wheel walker  Bed Position Chair  Range of Motion Active;All extremities

## 2015-12-29 NOTE — Progress Notes (Signed)
Orthopedic Tech Progress Note Patient Details:  Nancy Pratt [birth year] [id number] Called in order for Aspen Lumbar Brace [facility] Patient ID: Nancy Pratt, female   DOB: [birth year], 65 y.o.   MRN: [id number]   Nancy Pratt 12/29/2015, 10:05 AM

## 2015-12-30 MED ORDER — CYCLOBENZAPRINE HCL 10 MG PO TABS: 10.0000 mg | ORAL_TABLET | Freq: Three times a day (TID) | ORAL | 0 refills | Status: AC | PRN

## 2015-12-30 MED ORDER — OXYCODONE-ACETAMINOPHEN 5-325 MG PO TABS: 1.0000 | ORAL_TABLET | Freq: Four times a day (QID) | ORAL | 0 refills | Status: AC | PRN

## 2015-12-30 NOTE — Progress Notes (Signed)
Occupational Therapy Treatment Patient Details Name: Nancy Pratt MRN: [id number] DOB: April 10, 1950 Today's Date: 12/30/2015    History of present illness Pt is a 65 y.o. female s/p lumbar 4-5 posterior lumbar interbody fusion, lumbar laminectomy for decompression L4,L5 nerve roots, and non segmental pedicle screw fixation L4, L5. Pt has a past medical history significant for but not limited to hypertension.   OT comments  Pt received in recliner and eager to participate in OT session. Pt currently requires supervision for all ADL and functional mobility. Reinforced education concerning donning/doffing brace, back precautions, dressing/bathing techniques, and use of ice for pain management. Pt verbalized and demonstrated understanding and has no further questions or concerns. D/C plan remains appropriate and pt will have 24 hour supervision at home from family. Pt has no further acute OT needs. OT will sign off.  Follow Up Recommendations  No OT follow up;Supervision/Assistance - 24 hour    Equipment Recommendations  None recommended by OT       Precautions / Restrictions Precautions Precautions: Back Precaution Booklet Issued: No Precaution Comments: Reviewed back precautions and handout. Required Braces or Orthoses: Spinal Brace (Aspen lumbar brace) Spinal Brace: Lumbar corset;Applied in sitting position Restrictions Weight Bearing Restrictions: No       Mobility Bed Mobility               General bed mobility comments: Out of bed in chair on OT arrival.  Transfers Overall transfer level: Needs assistance Equipment used: None Transfers: Sit to/from Stand Sit to Stand: Supervision              Balance Overall balance assessment: Needs assistance Sitting-balance support: No upper extremity supported;Feet supported Sitting balance-Leahy Scale: Good     Standing balance support: No upper extremity supported;During functional activity Standing balance-Leahy  Scale: Fair                     ADL Overall ADL's : Needs assistance/impaired     Grooming: Supervision/safety;Standing                   Toilet Transfer: Supervision/safety;Ambulation       Tub/ Shower Transfer: Supervision/safety;Shower seat;Ambulation   Functional mobility during ADLs: Supervision/safety General ADL Comments: Educated pt on back precautions during ADL, donning/doffing Aspen lumbar brace, tub transfer, and toilet transfer.                Cognition   Behavior During Therapy: WFL for tasks assessed/performed Overall Cognitive Status: Within Functional Limits for tasks assessed                                    Pertinent Vitals/ Pain       Pain Assessment: 0-10 Pain Score: 5  Pain Location: back Pain Descriptors / Indicators: Aching;Sore Pain Intervention(s): Monitored during session;Repositioned         Frequency  Min 2X/week        Progress Toward Goals  OT Goals(current goals can now be found in the care plan section)  Progress towards OT goals: Progressing toward goals  Acute Rehab OT Goals Patient Stated Goal: to be independent OT Goal Formulation: With patient Time For Goal Achievement: 01/12/16 Potential to Achieve Goals: Good ADL Goals Pt Will Transfer to Toilet: with modified independence;ambulating Pt Will Perform Toileting - Clothing Manipulation and hygiene: with supervision;sit to/from stand Pt Will Perform Tub/Shower Transfer: with supervision;shower seat;ambulating Additional ADL  Goal #1: Pt will independently don and doff Aspen lumbar brace while adhering to back precautions.  Plan Discharge plan remains appropriate       End of Session Equipment Utilized During Treatment: Gait belt   Activity Tolerance Patient tolerated treatment well   Patient Left with call bell/phone within reach;in chair;with chair alarm set   Nurse Communication          Time: [phone] [id number] OT Time Calculation  (min): 20 min  Charges: OT General Charges $OT Visit: 1 Procedure OT Treatments $Self Care/Home Management : 8-22 mins  Doristine SectionCharity A Adean Milosevic, OTR/L [phone] 12/30/2015, 1:23 PM

## 2015-12-30 NOTE — Progress Notes (Signed)
Physical Therapy Treatment Patient Details Name: Nancy Pratt MRN: [id number] DOB: Feb 07, 1951 Today's Date: 12/30/2015    History of Present Illness Pt is a 65 y.o. female s/p lumbar 4-5 posterior lumbar interbody fusion, lumbar laminectomy for decompression L4,L5 nerve roots, and non segmental pedicle screw fixation L4, L5. Pt has a past medical history significant for but not limited to hypertension.    PT Comments    Pt has progressed well with all mobility.  Reviewed all education.  Pt states she is feeling prepared with everything.  Follow Up Recommendations  No PT follow up     Equipment Recommendations  None recommended by PT    Recommendations for Other Services       Precautions / Restrictions Precautions Precautions: Back Precaution Booklet Issued: Yes (comment) Precaution Comments: Reviewed back precautions and handout. Required Braces or Orthoses: Spinal Brace Spinal Brace: Lumbar corset;Applied in sitting position Restrictions Weight Bearing Restrictions: No    Mobility  Bed Mobility               General bed mobility comments: OOB in recliner  Transfers Overall transfer level: Modified independent Equipment used: None Transfers: Sit to/from Stand Sit to Stand: Modified independent (Device/Increase time)            Ambulation/Gait Ambulation/Gait assistance: Modified independent (Device/Increase time) Ambulation Distance (Feet): 450 Feet Assistive device: None Gait Pattern/deviations: Step-through pattern   Gait velocity interpretation: at or above normal speed for age/gender General Gait Details: steady, fluid with ability to increase speed appreciably   Stairs            Wheelchair Mobility    Modified Rankin (Stroke Patients Only)       Balance Overall balance assessment: Needs assistance Sitting-balance support: No upper extremity supported;Feet supported Sitting balance-Leahy Scale: Good     Standing balance  support: No upper extremity supported;During functional activity Standing balance-Leahy Scale: Good                      Cognition Arousal/Alertness: Awake/alert Behavior During Therapy: WFL for tasks assessed/performed Overall Cognitive Status: Within Functional Limits for tasks assessed                      Exercises      General Comments General comments (skin integrity, edema, etc.): Reviewed all education and pt verbalized understanding      Pertinent Vitals/Pain Pain Assessment: 0-10 Pain Score: 5  Pain Location: back Pain Descriptors / Indicators: Aching Pain Intervention(s): Monitored during session;Repositioned    Home Living                      Prior Function            PT Goals (current goals can now be found in the care plan section) Acute Rehab PT Goals Patient Stated Goal: to be independent PT Goal Formulation: With patient Time For Goal Achievement: 12/31/15 Potential to Achieve Goals: Good Progress towards PT goals: Progressing toward goals;Goals met/education completed, patient discharged from PT    Frequency    Min 5X/week      PT Plan Current plan remains appropriate    Co-evaluation             End of Session Equipment Utilized During Treatment: Back brace Activity Tolerance: Patient tolerated treatment well Patient left:  (up in room)     Time: 6767-2094 PT Time Calculation (min) (ACUTE ONLY): 18 min  Charges:  $  Gait Training: 8-22 mins                    G Codes:      Lachelle Rissler, Tessie Fass 12/30/2015, 1:40 PM  12/30/2015  Donnella Sham, [place] [phone]  (pager)

## 2015-12-30 NOTE — Discharge Instructions (Signed)
Spinal Fusion Care After Refer to this sheet in the next few weeks. These instructions provide you with information on caring for yourself after your procedure. Your caregiver may also give you more specific instructions. Your treatment has been planned according to current medical practices, but problems sometimes occur. Call your caregiver if you have any problems or questions after your procedure. HOME CARE INSTRUCTIONS   Take whatever pain medicine has been prescribed by your caregiver. Do not take over-the-counter pain medicine unless directed otherwise by your caregiver.   Do not drive if you are taking narcotic pain medicines.   Change your bandage (dressing) if necessary or as directed by your caregiver.   You may shower. The wound may get wet, simply pat the area dry. It will take ~2 weeks for the glue to peel off.  If you have been prescribed medicine to prevent your blood from clotting, follow the directions carefully.   Check the area around your incision often. Look for redness and swelling. Also, look for anything leaking from your wound. You can use a mirror or have a family member inspect your incision if it is in a place where it is difficult for you to see.   Ask your caregiver what activities you should avoid and for how long.   Walk as much as possible.   Do not lift anything heavier than 5 lbs until your caregiver says it is safe.   Do not twist or bend for a few weeks. Try not to pull on things. Avoid sitting for long periods of time. Change positions at least every hour.   Please remove your bandage tomorrow morning

## 2015-12-31 NOTE — Discharge Summary (Signed)
Physician Discharge Summary  Patient ID: Nancy Pratt MRN: [id number] DOB/AGE: [birth year] 65 y.o.  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 Jenelle MagesHairston was admitted and taken to the operating room for an uncomplicated lumbar decompression and fusion at L4/5. Post op she has done very well. She is ambulating , voiding, and tolerating a regular diet. Her wound is clean, dry, and without signs of infection. She will be discharged today.   Treatments: surgery: LUMBAR FOUR-FIVE POSTERIOR LUMBAR INTERBODY FUSION, 11mm cage on right, 12mm x 24mm on left. Autograft and allograft morsels in the cages(Synthes) Lumbar laminectomy for decompression L4, L5 nerve roots Non segmental pedicle screw fixation L4, L5   Discharge Exam: Blood pressure (!) 92/47, pulse 63, temperature 98 F (36.7 C), temperature source Oral, resp. rate 18, height 5\' 7"  (1.702 m), weight 86 kg (189 lb 9.6 oz), SpO2 96 %. General appearance: alert, cooperative, appears stated age and no distress Neurologic: Alert and oriented X 3, normal strength and tone. Normal symmetric reflexes. Normal coordination and gait  Disposition: Final discharge disposition not confirmed SPONDYLOLISTHESIS, LUMBAR REGION    Medication List    TAKE these medications   acyclovir 800 MG tablet Commonly known as:  ZOVIRAX Take 800 mg by mouth daily.   cyclobenzaprine 10 MG tablet Commonly known as:  FLEXERIL Take 1 tablet (10 mg total) by mouth 3 (three) times daily as needed for muscle spasms.   estradiol-norethindrone 1-0.5 MG tablet Commonly known as:  ACTIVELLA Take 1 tablet by mouth daily.   OVER THE COUNTER MEDICATION Take 1 capsule by mouth daily. Supplement for hot flashes   oxyCODONE-acetaminophen 5-325 MG tablet Commonly known as:  ROXICET Take 1 tablet by mouth every 6 (six) hours as needed for severe pain.   traMADol 50 MG tablet Commonly known as:  ULTRAM 1 TABLET AT BEDTIME   valsartan-hydrochlorothiazide 80-12.5 MG tablet Commonly known as:  DIOVAN-HCT Take 1 tablet by mouth daily.   Vitamin D (Ergocalciferol) 50000 units Caps capsule Commonly known as:  DRISDOL Take 50,000 Units by mouth every 7 (seven) days. Mondays      Follow-up Information    Alethea Terhaar L, MD .   Specialty:  Neurosurgery Why:  please call the office to make an appointment Contact information: [address] [place] [address] [phone]           Signed: Carmela HurtCABBELL,Jisel Fleet L 12/31/2015, 12:26 AM

## 2017-03-11 IMAGING — RF DG C-ARM 61-120 MIN
1 series · 2 of 2 positions shown · non-contrast
Comparison: Film from earlier in the same day

CLINICAL DATA: L4-5 PLIF

EXAM:
LUMBAR SPINE - 2-3 VIEW; DG C-ARM 61-120 MIN

[Series 1: run · 2 of 2 slices shown]
[im 1/2]
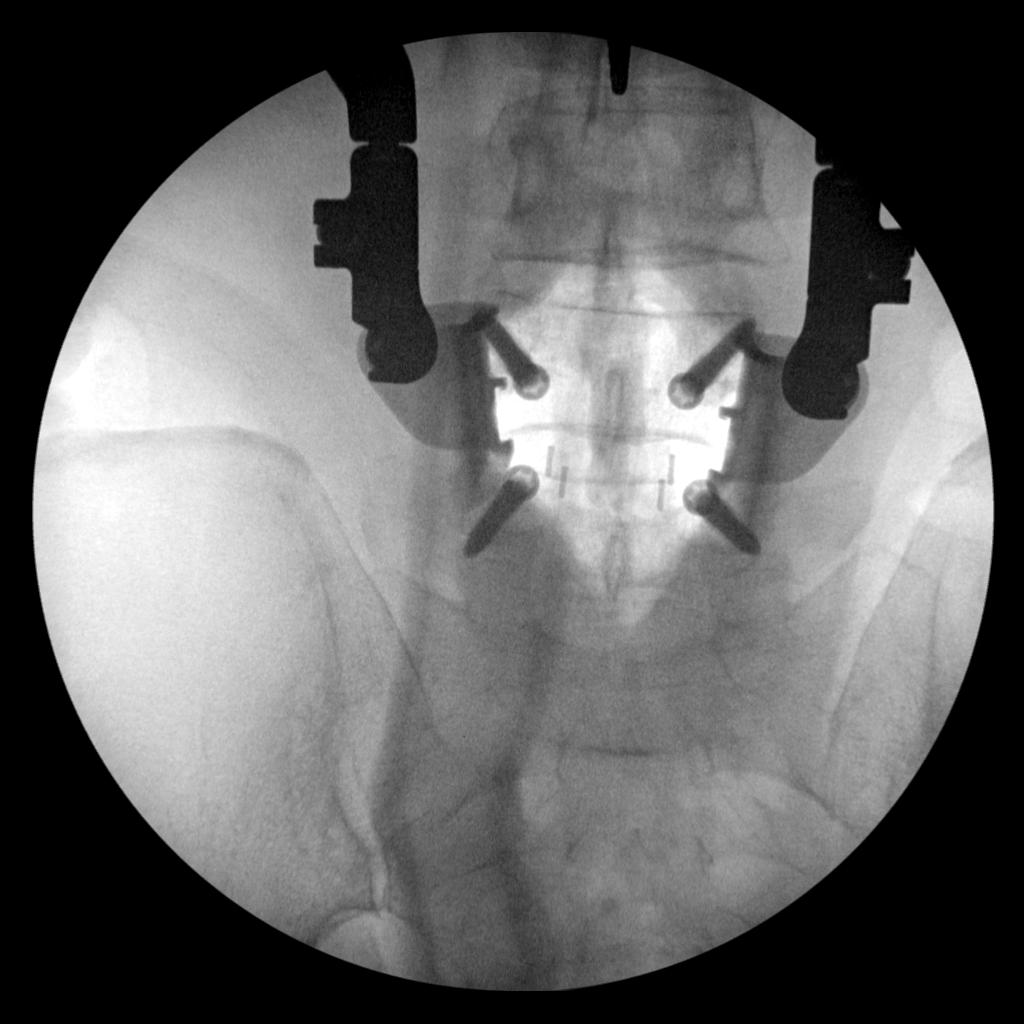
[im 2/2]
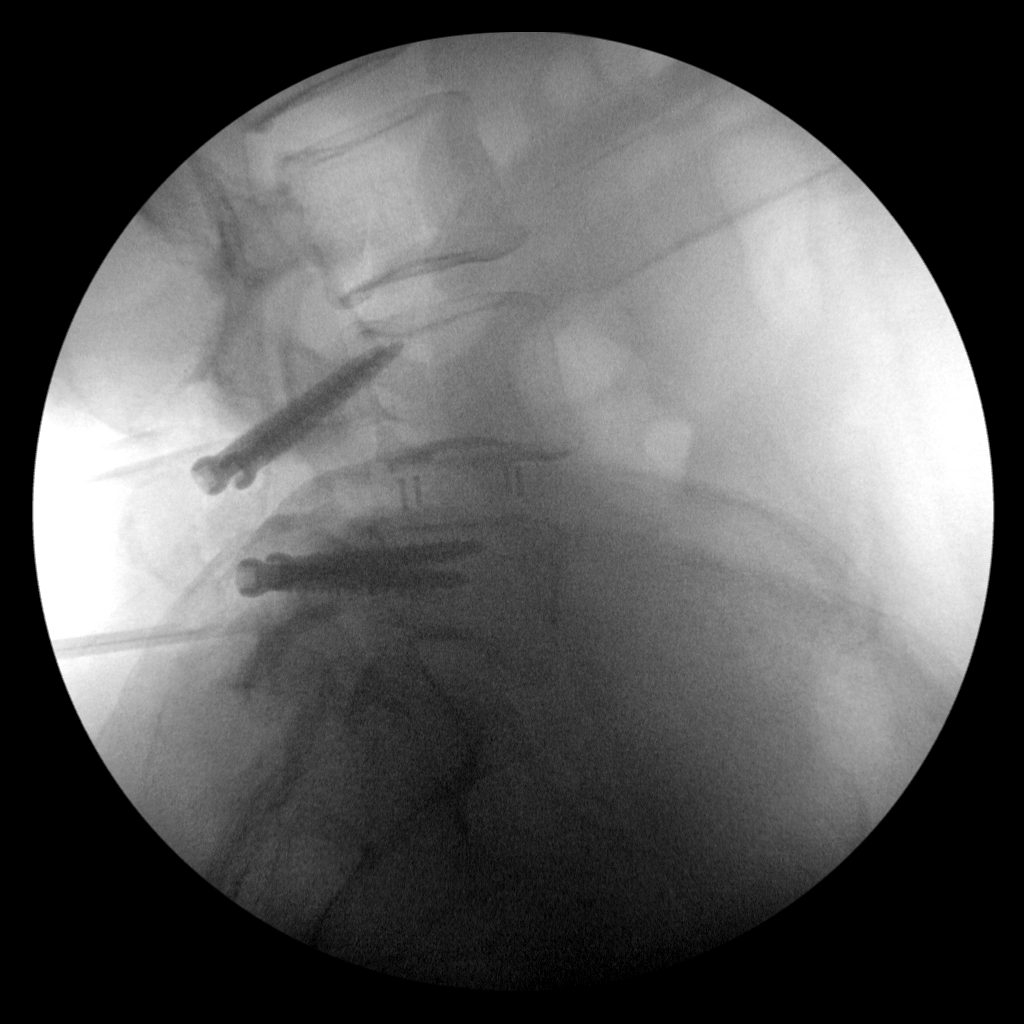

[2 of 2 positions shown; findings below may reference images not displayed]

FLUOROSCOPY TIME:  Fluoroscopy Time:  54 seconds

Radiation Exposure Index (if provided by the fluoroscopic device):
Not available

Number of Acquired Spot Images: 2
FINDINGS: Pedicle screws are noted at L4-5 and L5 with interbody fusion at
L4-5.
IMPRESSION: L4-5 PLIF

## 2022-07-31 ENCOUNTER — Other Ambulatory Visit (HOSPITAL_COMMUNITY): Payer: Self-pay | Admitting: Otolaryngology

## 2022-07-31 DIAGNOSIS — M25561 Pain in right knee: Secondary | ICD-10-CM

## 2022-08-23 ENCOUNTER — Ambulatory Visit (HOSPITAL_COMMUNITY)
Admission: RE | Admit: 2022-08-23 | Discharge: 2022-08-23 | Disposition: A | Discharge status: Home / Self Care | Payer: Medicare Other | Source: Ambulatory Visit | Attending: Otolaryngology | Admitting: Otolaryngology

## 2022-08-23 DIAGNOSIS — M25561 Pain in right knee: Secondary | ICD-10-CM | POA: Diagnosis present
